# Patient Record
Sex: Male | Born: 1980 | Race: Black or African American | Hispanic: No | Marital: Married | Smoking: Current every day smoker
Health system: Southern US, Community
[De-identification: ages and names within clinical notes are randomized; demographics above are authoritative.]

## PROBLEM LIST (undated history)

## (undated) HISTORY — PX: MOUTH SURGERY: SHX715

---

## 2004-09-29 ENCOUNTER — Emergency Department: Payer: Self-pay | Admitting: Internal Medicine

## 2005-05-08 ENCOUNTER — Emergency Department: Payer: Self-pay | Admitting: General Practice

## 2007-11-14 ENCOUNTER — Emergency Department: Payer: Self-pay | Admitting: Emergency Medicine

## 2009-05-05 ENCOUNTER — Emergency Department: Payer: Self-pay | Admitting: Unknown Physician Specialty

## 2010-11-01 ENCOUNTER — Emergency Department: Payer: Self-pay | Admitting: Emergency Medicine

## 2012-03-27 ENCOUNTER — Emergency Department: Payer: Self-pay | Admitting: Emergency Medicine

## 2012-03-27 LAB — URINALYSIS, COMPLETE
Bacteria: NONE SEEN
Bilirubin,UR: NEGATIVE
Ketone: NEGATIVE
Leukocyte Esterase: NEGATIVE
Nitrite: NEGATIVE
Ph: 6 (ref 4.5–8.0)
Protein: NEGATIVE
Specific Gravity: 1.027 (ref 1.003–1.030)
Squamous Epithelial: NONE SEEN

## 2012-03-27 LAB — BASIC METABOLIC PANEL
BUN: 20 mg/dL — ABNORMAL HIGH (ref 7–18)
Calcium, Total: 9 mg/dL (ref 8.5–10.1)
Chloride: 106 mmol/L (ref 98–107)
Co2: 27 mmol/L (ref 21–32)
EGFR (Non-African Amer.): >60
Glucose: 87 mg/dL (ref 65–99)
Osmolality: 280 (ref 275–301)
Potassium: 3.4 mmol/L — ABNORMAL LOW (ref 3.5–5.1)
Sodium: 139 mmol/L (ref 136–145)

## 2012-03-27 LAB — CBC
HCT: 45.9 % (ref 40.0–52.0)
HGB: 15.3 g/dL (ref 13.0–18.0)
MCV: 93 fL (ref 80–100)
Platelet: 297 10*3/uL (ref 150–440)
RBC: 4.96 10*6/uL (ref 4.40–5.90)
RDW: 14.4 % (ref 11.5–14.5)
WBC: 9.8 10*3/uL (ref 3.8–10.6)

## 2012-08-04 ENCOUNTER — Emergency Department: Payer: Self-pay | Admitting: Emergency Medicine

## 2013-03-20 ENCOUNTER — Emergency Department: Payer: Self-pay | Admitting: Emergency Medicine

## 2013-07-25 ENCOUNTER — Emergency Department: Payer: Self-pay | Admitting: Emergency Medicine

## 2014-04-18 ENCOUNTER — Emergency Department: Payer: Self-pay | Admitting: Internal Medicine

## 2014-11-22 ENCOUNTER — Emergency Department: Payer: Self-pay

## 2014-11-22 ENCOUNTER — Emergency Department
Admission: EM | Admit: 2014-11-22 | Discharge: 2014-11-22 | Disposition: A | Discharge status: Home / Self Care | Payer: Self-pay | Attending: Emergency Medicine | Admitting: Emergency Medicine

## 2014-11-22 ENCOUNTER — Encounter: Payer: Self-pay | Admitting: *Deleted

## 2014-11-22 DIAGNOSIS — S3991XA Unspecified injury of abdomen, initial encounter: Secondary | ICD-10-CM | POA: Insufficient documentation

## 2014-11-22 DIAGNOSIS — M545 Low back pain, unspecified: Secondary | ICD-10-CM

## 2014-11-22 DIAGNOSIS — Y9389 Activity, other specified: Secondary | ICD-10-CM | POA: Insufficient documentation

## 2014-11-22 DIAGNOSIS — Z720 Tobacco use: Secondary | ICD-10-CM | POA: Insufficient documentation

## 2014-11-22 DIAGNOSIS — S3992XA Unspecified injury of lower back, initial encounter: Secondary | ICD-10-CM | POA: Insufficient documentation

## 2014-11-22 DIAGNOSIS — Y998 Other external cause status: Secondary | ICD-10-CM | POA: Insufficient documentation

## 2014-11-22 DIAGNOSIS — Y92410 Unspecified street and highway as the place of occurrence of the external cause: Secondary | ICD-10-CM | POA: Insufficient documentation

## 2014-11-22 LAB — COMPREHENSIVE METABOLIC PANEL
ALT: 19 U/L (ref 17–63)
ANION GAP: 8 (ref 5–15)
AST: 24 U/L (ref 15–41)
Albumin: 3.4 g/dL — ABNORMAL LOW (ref 3.5–5.0)
Alkaline Phosphatase: 65 U/L (ref 38–126)
BUN: 10 mg/dL (ref 6–20)
CHLORIDE: 105 mmol/L (ref 101–111)
CO2: 23 mmol/L (ref 22–32)
Calcium: 8.7 mg/dL — ABNORMAL LOW (ref 8.9–10.3)
Creatinine, Ser: 1.34 mg/dL — ABNORMAL HIGH (ref 0.61–1.24)
GFR calc non Af Amer: >60 mL/min (ref >60)
Glucose, Bld: 79 mg/dL (ref 65–99)
POTASSIUM: 3.8 mmol/L (ref 3.5–5.1)
SODIUM: 136 mmol/L (ref 135–145)
Total Bilirubin: 0.5 mg/dL (ref 0.3–1.2)
Total Protein: 6.5 g/dL (ref 6.5–8.1)

## 2014-11-22 LAB — CBC
HEMATOCRIT: 39.3 % — AB (ref 40.0–52.0)
Hemoglobin: 13.2 g/dL (ref 13.0–18.0)
MCH: 32 pg (ref 26.0–34.0)
MCHC: 33.7 g/dL (ref 32.0–36.0)
MCV: 95.1 fL (ref 80.0–100.0)
PLATELETS: 279 10*3/uL (ref 150–440)
RBC: 4.14 MIL/uL — ABNORMAL LOW (ref 4.40–5.90)
RDW: 13.6 % (ref 11.5–14.5)
WBC: 10.1 10*3/uL (ref 3.8–10.6)

## 2014-11-22 MED ORDER — IOHEXOL 350 MG/ML SOLN
100.0000 mL | Freq: Once | INTRAVENOUS | Status: AC | PRN
  Administered 2014-11-22: 100 mL via INTRAVENOUS

## 2014-11-22 MED ORDER — MORPHINE SULFATE (PF) 4 MG/ML IV SOLN
INTRAVENOUS | Status: AC
  Administered 2014-11-22: 4 mg
  Filled 2014-11-22: qty 1

## 2014-11-22 MED ORDER — ONDANSETRON HCL 4 MG/2ML IJ SOLN
INTRAMUSCULAR | Status: AC
  Administered 2014-11-22: 4 mg
  Filled 2014-11-22: qty 2

## 2014-11-22 MED ORDER — OXYCODONE-ACETAMINOPHEN 5-325 MG PO TABS: 1.0000 | ORAL_TABLET | ORAL | Status: DC | PRN
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
## 2014-11-22 NOTE — Discharge Instructions (Signed)
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
## 2014-11-22 NOTE — ED Notes (Signed)
Pt. Going home with family. 

## 2015-08-05 ENCOUNTER — Encounter: Payer: Self-pay | Admitting: Emergency Medicine

## 2015-08-05 ENCOUNTER — Emergency Department
Admission: EM | Admit: 2015-08-05 | Discharge: 2015-08-06 | Disposition: A | Discharge status: Home / Self Care | Payer: No Typology Code available for payment source | Attending: Emergency Medicine | Admitting: Emergency Medicine

## 2015-08-05 DIAGNOSIS — F1290 Cannabis use, unspecified, uncomplicated: Secondary | ICD-10-CM | POA: Insufficient documentation

## 2015-08-05 DIAGNOSIS — T63001A Toxic effect of unspecified snake venom, accidental (unintentional), initial encounter: Secondary | ICD-10-CM

## 2015-08-05 DIAGNOSIS — F17200 Nicotine dependence, unspecified, uncomplicated: Secondary | ICD-10-CM | POA: Insufficient documentation

## 2015-08-05 DIAGNOSIS — T63011A Toxic effect of rattlesnake venom, accidental (unintentional), initial encounter: Secondary | ICD-10-CM | POA: Insufficient documentation

## 2015-08-05 MED ORDER — SODIUM CHLORIDE 0.9 % IV BOLUS (SEPSIS)
1000.0000 mL | Freq: Once | INTRAVENOUS | Status: AC
Start: 2015-08-05 — End: 2015-08-05
  Administered 2015-08-05: 1000 mL via INTRAVENOUS

## 2015-08-05 MED ORDER — MORPHINE SULFATE (PF) 4 MG/ML IV SOLN
4.0000 mg | Freq: Once | INTRAVENOUS | Status: AC
  Administered 2015-08-05: 4 mg via INTRAVENOUS
  Filled 2015-08-05: qty 1

## 2015-08-05 MED ORDER — ONDANSETRON HCL 4 MG/2ML IJ SOLN
4.0000 mg | Freq: Once | INTRAMUSCULAR | Status: AC
  Administered 2015-08-05: 4 mg via INTRAVENOUS
  Filled 2015-08-05: qty 2
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
## 2015-08-05 NOTE — ED Provider Notes (Addendum)
[facility] Emergency Department Provider Note   ____________________________________________  Time seen: Approximately 850 PM  I have reviewed the triage vital signs and the nursing notes.   HISTORY  Chief Complaint Snake Bite   HPI Jake Goodwin is a 35 y.o. male without any chronic medical problems was presented to the emergency department after a copperhead bite at about 7:30 PM tonight. He says he was mowing his yard in sandals when he felt pain to the right lateral part of his right foot. He said that he saw the snake embedded in his foot at that time. He describes as a baby copperhead being about 6-7 inches long. He did not bring in a picture of the snake or the snake itself.  He said it may have been embedded in his foot for about 5-10 seconds. He describes her pain to the right lateral side of his foot at this time with mild swelling. He says that he is also had some nausea and vomiting and headache from the pain. Says that his last tetanus shot was 4 years ago.   History reviewed. No pertinent past medical history.  There are no active problems to display for this patient.   Past Surgical History  Procedure Laterality Date  . Mouth surgery      Current Outpatient Rx  Name  Route  Sig  Dispense  Refill  . oxyCODONE-acetaminophen (ROXICET) 5-325 MG tablet   Oral   Take 1 tablet by mouth every 4 (four) hours as needed for severe pain.   20 tablet   0     Allergies Review of patient's allergies indicates no known allergies.  No family history on file.  Social History Social History  Substance Use Topics  . Smoking status: Current Every Day Smoker  . Smokeless tobacco: Never Used  . Alcohol Use: No    Review of Systems Constitutional: No fever/chills Eyes: No visual changes. ENT: No sore throat. Cardiovascular: Denies chest pain. Respiratory: Denies shortness of breath. Gastrointestinal: No abdominal pain.  No diarrhea.  No  constipation. Genitourinary: Negative for dysuria. Musculoskeletal: Negative for back pain. Skin: Negative for rash. Neurological: Negative for focal weakness or numbness.  10-point ROS otherwise negative.  ____________________________________________   PHYSICAL EXAM:  VITAL SIGNS: ED Triage Vitals  Enc Vitals Group     BP 08/05/15 2053 124/73 mmHg     Pulse Rate 08/05/15 2053 96     Resp 08/05/15 2053 17     Temp --      Temp src --      SpO2 08/05/15 2053 98 %     Weight 08/05/15 2038 208 lb (94.348 kg)     Height 08/05/15 2038 5\' 10"  (1.778 m)     Head Cir --      Peak Flow --      Pain Score 08/05/15 2039 9     Pain Loc --      Pain Edu? --      Excl. in GC? --     Constitutional: Alert and oriented. Well appearing and in no acute distress. Eyes: Conjunctivae are normal. PERRL. EOMI. Head: Atraumatic. Nose: No congestion/rhinnorhea. Mouth/Throat: Mucous membranes are moist.  Oropharynx non-erythematous. Neck: No stridor.   Cardiovascular: Normal rate, regular rhythm. Grossly normal heart sounds.  Good peripheral circulation. Respiratory: Normal respiratory effort.  No retractions. Lungs CTAB. Gastrointestinal: Soft and nontender. No distention. No abdominal bruits. No CVA tenderness. Musculoskeletal: No lower extremity tenderness nor edema.  No joint effusions.Right lateral area of the foot with possible 1 cm area of swelling but this is directly over the base of the fifth metatarsal nurses similar area to the left foot. However, it is exactly where the patient's wife says he was bitten. Tenderness over this area. Neurologic:  Normal speech and language. No gross focal neurologic deficits are appreciated. No gait instability. sensation is intact to light touch. Skin:  Skin is warm, dry and intact. No rash noted.No bite marks noted. Psychiatric: Mood and affect are normal. Speech and behavior are normal.  ____________________________________________   LABS (all  labs ordered are listed, but only abnormal results are displayed)  Labs Reviewed - No data to display ____________________________________________  EKG   ____________________________________________  RADIOLOGY   ____________________________________________   PROCEDURES   Procedures    ____________________________________________   INITIAL IMPRESSION / ASSESSMENT AND PLAN / ED COURSE  Pertinent labs & imaging results that were available during my care of the patient were reviewed by me and considered in my medical decision making (see chart for details).  ----------------------------------------- 9:29 PM on 08/05/2015 -----------------------------------------  Discussed the case [facility] who has faxed over the guidelines for care of copperhead bite. I do not observe any swelling at this time. However, the patient does have increased pain. We will elevate the foot and reassess.  ----------------------------------------- 11:08 PM on 08/05/2015 -----------------------------------------  Patient's foot remains elevated at 45 angle. The patient is resting comfortably now the pain is relieved. We'll continue to observe for full 6 hour observation period. No swelling. Pain has not increased. At this point, it appears that the patient has not had an envenomation but more a dry bite. Signed out to Dr. Dolores FrameSung ____________________________________________   FINAL CLINICAL IMPRESSION(S) / ED DIAGNOSES  Snake bite.    NEW MEDICATIONS STARTED DURING THIS VISIT:  New Prescriptions   No medications on file     Note:  This document was prepared using Dragon voice recognition software and may include unintentional dictation errors.    Myrna Blazeravid Matthew Jakiah Bienaime, MD 08/05/15 2342  Patient went for wanting to leave at this time. The patient is currently pain-free at this time. There has been no swelling. The patient is completely sensate to light  touch. It is not completely ideal because ideally we would do a 6 hour observation. However, the symptoms have only been improving. It is likely that the patient did not have an envenomation. Because we are discharging the patient, upon their insistence, prior to the desired observation period, I did discussed return precautions with the patient as well as his wife. If there is any worsening in pain or swelling or any worsening or concerning symptoms they will return immediately. They're understanding of these plans and wanted to comply.  Myrna Blazeravid Matthew Mohamed Portlock, MD 08/06/15 [phone]

## 2015-08-06 NOTE — ED Notes (Signed)
Discharge instructions reviewed with patient. Patient verbalized understanding. Patient taken out to lobby via wheelchair by wife

## 2015-08-06 NOTE — ED Notes (Signed)
Pt called out. 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 We're not putting up with this 'watching him' bullshit anymore." MD notified and to bedside to speak with pt and wife. Pt being discharged despite the fact that it is earlier than poison control's recommended 6 hour observation time.

## 2016-04-23 ENCOUNTER — Emergency Department
Admission: EM | Admit: 2016-04-23 | Discharge: 2016-04-23 | Disposition: A | Discharge status: Home / Self Care | Payer: Self-pay | Attending: Emergency Medicine | Admitting: Emergency Medicine

## 2016-04-23 ENCOUNTER — Encounter: Payer: Self-pay | Admitting: Intensive Care

## 2016-04-23 DIAGNOSIS — K029 Dental caries, unspecified: Secondary | ICD-10-CM | POA: Insufficient documentation

## 2016-04-23 DIAGNOSIS — F17210 Nicotine dependence, cigarettes, uncomplicated: Secondary | ICD-10-CM | POA: Insufficient documentation

## 2016-04-23 MED ORDER — IBUPROFEN 800 MG PO TABS: 800.0000 mg | ORAL_TABLET | Freq: Three times a day (TID) | ORAL | 0 refills | Status: DC

## 2016-04-23 MED ORDER — PENICILLIN V POTASSIUM 500 MG PO TABS: 500.0000 mg | ORAL_TABLET | Freq: Four times a day (QID) | ORAL | 0 refills | Status: AC

## 2016-04-23 MED ORDER — AMOXICILLIN 875 MG PO TABS: 875.0000 mg | ORAL_TABLET | Freq: Two times a day (BID) | ORAL | 0 refills | Status: DC

## 2016-04-23 NOTE — ED Provider Notes (Signed)
[facility] Emergency Department Provider Note  ____________________________________________   First MD Initiated Contact with Patient 04/23/16 1236     (approximate)  I have reviewed the triage vital signs and the nursing notes.   HISTORY  Chief Complaint Dental Problem   HPI Jake Goodwin is a 36 y.o. male is here with complaint of dental pain for last 2-3 weeks. Patient states there is a history of a mandibular fracture and was told by his dentist in [facility] later anyone else at work on his teeth. Patient states that he called and cannot get an appointment until July. Patient has been taking Goody powders very frequently. He denies any fever or chills. No difficulty with swallowing. He rates his pain as an 8/10.   History reviewed. No pertinent past medical history.  There are no active problems to display for this patient.   Past Surgical History:  Procedure Laterality Date  . MOUTH SURGERY      Prior to Admission medications   Medication Sig Start Date End Date Taking? Authorizing Provider  ibuprofen (ADVIL,MOTRIN) 800 MG tablet Take 1 tablet (800 mg total) by mouth 3 (three) times daily. 04/23/16   Tommi Rumps, PA-C  penicillin v potassium (VEETID) 500 MG tablet Take 1 tablet (500 mg total) by mouth 4 (four) times daily. 04/23/16 04/30/16  Tommi Rumps, PA-C    Allergies Patient has no known allergies.  History reviewed. No pertinent family history.  Social History Social History  Substance Use Topics  . Smoking status: Current Every Day Smoker    Types: Cigarettes  . Smokeless tobacco: Never Used  . Alcohol use Yes     Comment: weekends    Review of Systems Constitutional: No fever/chills ENT: Positive dental pain. Cardiovascular: Denies chest pain. Respiratory: Denies shortness of breath. Gastrointestinal:  No nausea, no vomiting.   Skin: Negative for rash. Neurological: Negative for headaches  10-point ROS  otherwise negative.  ____________________________________________   PHYSICAL EXAM:  VITAL SIGNS: ED Triage Vitals  Enc Vitals Group     BP 04/23/16 1221 127/73     Pulse Rate 04/23/16 1221 92     Resp 04/23/16 1221 16     Temp 04/23/16 1221 98.7 F (37.1 C)     Temp Source 04/23/16 1221 Oral     SpO2 04/23/16 1221 99 %     Weight 04/23/16 1223 208 lb (94.3 kg)     Height 04/23/16 1223  (1.778 m)     Head Circumference --      Peak Flow --      Pain Score 04/23/16 1223 8     Pain Loc --      Pain Edu? --      Excl. in GC? --     Constitutional: Alert and oriented. Well appearing and in no acute distress. Eyes: Conjunctivae are normal. PERRL. EOMI. Head: Atraumatic. Nose: No congestion/rhinnorhea. Mouth/Throat: Mucous membranes are moist.  Oropharynx non-erythematous. Right lower wisdom tooth with cavity present. Moderate tenderness on palpation with a tongue depressor. There is minimal soft tissue swelling surrounding it however this is also tender. There is no active drainage noted. Neck: No stridor.   Hematological/Lymphatic/Immunilogical: Minimal right cervical lymphadenopathy. Cardiovascular: Normal rate, regular rhythm. Grossly normal heart sounds.  Good peripheral circulation. Respiratory: Normal respiratory effort.  No retractions. Lungs CTAB. Musculoskeletal: Moves upper and lower extremities without any difficulty. Normal gait was noted. Neurologic:  Normal speech and language. No gross focal  neurologic deficits are appreciated. No gait instability. Skin:  Skin is warm, dry and intact. No rash noted. Psychiatric: Mood and affect are normal. Speech and behavior are normal.  ____________________________________________   LABS (all labs ordered are listed, but only abnormal results are displayed)  Labs Reviewed - No data to display   PROCEDURES  Procedure(s) performed: None  Procedures  Critical Care performed:  No  ____________________________________________   INITIAL IMPRESSION / ASSESSMENT AND PLAN / ED COURSE  Pertinent labs & imaging results that were available during my care of the patient were reviewed by me and considered in my medical decision making (see chart for details).  Patient is given a prescription for Pen-Vee K 500 mg 1 every 6 hours for the next 7 days. He was also given ibuprofen every 8 hours with food as needed for pain. He is to discontinue taking Goody powders. He is also encouraged to call his dentist in [place] for any cancellations since he was told not to see any dentist other than [facility].      ____________________________________________   FINAL CLINICAL IMPRESSION(S) / ED DIAGNOSES  Final diagnoses:  Pain due to dental caries      NEW MEDICATIONS STARTED DURING THIS VISIT:  Discharge Medication List as of 04/23/2016  1:16 PM    START taking these medications   Details  penicillin v potassium (VEETID) 500 MG tablet Take 1 tablet (500 mg total) by mouth 4 (four) times daily., Starting Thu 04/23/2016, Until Thu 04/30/2016, Print         Note:  This document was prepared using Dragon voice recognition software and may include unintentional dictation errors.    Tommi Rumps, PA-C 04/23/16 1422    Jennye Moccasin, MD 04/29/16 0001

## 2016-04-23 NOTE — ED Triage Notes (Signed)
Patient c/o pain from his Right side lower wisdom tooth pain X2 weeks ago. Patient reports he sees a Education officer, communitydentist but has not scheduled an appointment yet. Minimal swelling noted

## 2016-04-23 NOTE — ED Notes (Signed)
FIRST NURSE: lower jaw dental pain (Right side) , x2 weeks

## 2016-04-23 NOTE — ED Notes (Signed)

## 2016-04-23 NOTE — Discharge Instructions (Signed)
Begin taking Pen V K 500 mg every 6 hours for infection.  Ibuprofen every 8 hours with food as needed for pain. Call your doctor for any cancellations. Soft foods until seen by your dentist.

## 2017-03-17 ENCOUNTER — Emergency Department: Payer: Self-pay

## 2017-03-17 ENCOUNTER — Other Ambulatory Visit: Payer: Self-pay

## 2017-03-17 ENCOUNTER — Emergency Department
Admission: EM | Admit: 2017-03-17 | Discharge: 2017-03-17 | Disposition: A | Discharge status: Home / Self Care | Payer: Self-pay | Attending: Emergency Medicine | Admitting: Emergency Medicine

## 2017-03-17 DIAGNOSIS — F17210 Nicotine dependence, cigarettes, uncomplicated: Secondary | ICD-10-CM | POA: Insufficient documentation

## 2017-03-17 DIAGNOSIS — R112 Nausea with vomiting, unspecified: Secondary | ICD-10-CM | POA: Insufficient documentation

## 2017-03-17 DIAGNOSIS — R1010 Upper abdominal pain, unspecified: Secondary | ICD-10-CM | POA: Insufficient documentation

## 2017-03-17 DIAGNOSIS — Z79899 Other long term (current) drug therapy: Secondary | ICD-10-CM | POA: Insufficient documentation

## 2017-03-17 LAB — URINALYSIS, COMPLETE (UACMP) WITH MICROSCOPIC
BACTERIA UA: NONE SEEN
Bilirubin Urine: NEGATIVE
Glucose, UA: NEGATIVE mg/dL
Hgb urine dipstick: NEGATIVE
KETONES UR: 5 mg/dL — AB
Leukocytes, UA: NEGATIVE
Nitrite: NEGATIVE
PH: 5 (ref 5.0–8.0)
Protein, ur: NEGATIVE mg/dL
SPECIFIC GRAVITY, URINE: 1.025 (ref 1.005–1.030)

## 2017-03-17 LAB — COMPREHENSIVE METABOLIC PANEL
ALBUMIN: 4.3 g/dL (ref 3.5–5.0)
ALT: 36 U/L (ref 17–63)
AST: 38 U/L (ref 15–41)
Alkaline Phosphatase: 79 U/L (ref 38–126)
Anion gap: 10 (ref 5–15)
BUN: 15 mg/dL (ref 6–20)
CHLORIDE: 105 mmol/L (ref 101–111)
CO2: 23 mmol/L (ref 22–32)
CREATININE: 1.46 mg/dL — AB (ref 0.61–1.24)
Calcium: 9.7 mg/dL (ref 8.9–10.3)
GFR calc Af Amer: >60 mL/min (ref >60)
GFR calc non Af Amer: 60 mL/min — ABNORMAL LOW (ref >60)
Glucose, Bld: 105 mg/dL — ABNORMAL HIGH (ref 65–99)
Potassium: 4 mmol/L (ref 3.5–5.1)
SODIUM: 138 mmol/L (ref 135–145)
Total Bilirubin: 0.9 mg/dL (ref 0.3–1.2)
Total Protein: 8 g/dL (ref 6.5–8.1)

## 2017-03-17 LAB — CBC
HEMATOCRIT: 43.8 % (ref 40.0–52.0)
Hemoglobin: 14.7 g/dL (ref 13.0–18.0)
MCH: 30.6 pg (ref 26.0–34.0)
MCHC: 33.5 g/dL (ref 32.0–36.0)
MCV: 91.3 fL (ref 80.0–100.0)
PLATELETS: 308 10*3/uL (ref 150–440)
RBC: 4.8 MIL/uL (ref 4.40–5.90)
RDW: 14.3 % (ref 11.5–14.5)
WBC: 7.5 10*3/uL (ref 3.8–10.6)

## 2017-03-17 LAB — LIPASE, BLOOD: LIPASE: 29 U/L (ref 11–51)

## 2017-03-17 MED ORDER — OMEPRAZOLE MAGNESIUM 20 MG PO TBEC: 20.0000 mg | DELAYED_RELEASE_TABLET | Freq: Every day | ORAL | 1 refills | Status: DC

## 2017-03-17 MED ORDER — ONDANSETRON HCL 4 MG PO TABS: ORAL_TABLET | ORAL | 0 refills | Status: DC

## 2017-03-17 MED ORDER — SUCRALFATE 1 G PO TABS: 1.0000 g | ORAL_TABLET | Freq: Four times a day (QID) | ORAL | 1 refills | Status: DC | PRN

## 2017-03-17 NOTE — ED Triage Notes (Signed)
Pt c/o upper abd pain with N/V since yesterday. Denies diarrhea. Denies hx of the same..Marland Kitchen

## 2017-03-17 NOTE — ED Notes (Signed)
Pt back from [place]

## 2017-03-17 NOTE — ED Provider Notes (Signed)
[facility] Emergency Department Provider Note  ____________________________________________   First MD Initiated Contact with Patient 03/17/17 1507     (approximate)  I have reviewed the triage vital signs and the nursing notes.   HISTORY  Chief Complaint Abdominal Pain    HPI Jake Goodwin is a 37 y.o. male with no specific chronic history who presents for evaluation of acute onset upper abdominal pain with associated nausea and vomiting that is been going on for a little more than 24 hours.  He states that he does drink alcohol but had not been drinking alcohol recently when the symptoms started.  He says that it started in the morning and has been severe but waxing and waning in intensity.  He states it is in his upper abdomen right below his ribs and sometimes radiates through to the back.  His nausea comes and goes and he is very sensitive to smells and has vomited several times.  He has not had much of an appetite and when he does try to eat he throws up.  He has no lower abdominal pain and no testicular pain.  He denies fever/chills, chest pain, shortness of breath, and dysuria.  He has never had similar symptoms in the past.  He describes the symptoms as severe overall.  History reviewed. No pertinent past medical history.  There are no active problems to display for this patient.   Past Surgical History:  Procedure Laterality Date  . MOUTH SURGERY      Prior to Admission medications   Medication Sig Start Date End Date Taking? Authorizing Provider  ibuprofen (ADVIL,MOTRIN) 800 MG tablet Take 1 tablet (800 mg total) by mouth 3 (three) times daily. 04/23/16   Tommi Rumps, PA-C  omeprazole (PRILOSEC OTC) 20 MG tablet Take 1 tablet (20 mg total) by mouth daily. 03/17/17 03/17/18  Loleta Rose, MD  ondansetron (ZOFRAN) 4 MG tablet Take 1-2 tabs by mouth every 8 hours as needed for nausea/vomiting 03/17/17   Loleta Rose, MD  sucralfate (CARAFATE)  1 g tablet Take 1 tablet (1 g total) by mouth 4 (four) times daily as needed (for abdominal discomfort, nausea, and/or vomiting). 03/17/17   Loleta Rose, MD    Allergies Patient has no known allergies.  No family history on file.  Social History Social History   Tobacco Use  . Smoking status: Current Every Day Smoker    Types: Cigarettes  . Smokeless tobacco: Never Used  Substance Use Topics  . Alcohol use: Yes    Comment: weekends  . Drug use: Yes    Types: Marijuana    Review of Systems Constitutional: No fever/chills Eyes: No visual changes. ENT: No sore throat. Cardiovascular: Denies chest pain. Respiratory: Denies shortness of breath. Gastrointestinal: Upper abdominal pain with associated nausea and vomiting as described above Genitourinary: Negative for dysuria. Musculoskeletal: Negative for neck pain.  Negative for back pain. Integumentary: Negative for rash. Neurological: Negative for headaches, focal weakness or numbness.   ____________________________________________   PHYSICAL EXAM:  VITAL SIGNS: ED Triage Vitals [03/17/17 1234]  Enc Vitals Group     BP 123/87     Pulse Rate 75     Resp 16     Temp 98.1 F (36.7 C)     Temp Source Oral     SpO2 100 %     Weight 96.2 kg (212 lb)     Height 1.778 m (5\' 10" )     Head Circumference  Peak Flow      Pain Score 6     Pain Loc      Pain Edu?      Excl. in GC?     Constitutional: Alert and oriented. Well appearing and in no acute distress. Eyes: Conjunctivae are normal.  Head: Atraumatic. Nose: No congestion/rhinnorhea. Mouth/Throat: Mucous membranes are moist. Neck: No stridor.  No meningeal signs.   Cardiovascular: Normal rate, regular rhythm. Good peripheral circulation. Grossly normal heart sounds. Respiratory: Normal respiratory effort.  No retractions. Lungs CTAB. Gastrointestinal: Soft with tenderness to palpation of the epigastrium and right upper quadrant but equivocal Murphy sign.   Nondistended.  No tenderness to palpation of the lower abdomen. Musculoskeletal: No lower extremity tenderness nor edema. No gross deformities of extremities. Neurologic:  Normal speech and language. No gross focal neurologic deficits are appreciated.  Skin:  Skin is warm, dry and intact. No rash noted. Psychiatric: Mood and affect are normal. Speech and behavior are normal.  ____________________________________________   LABS (all labs ordered are listed, but only abnormal results are displayed)  Labs Reviewed  COMPREHENSIVE METABOLIC PANEL - Abnormal; Notable for the following components:      Result Value   Glucose, Bld 105 (*)    Creatinine, Ser 1.46 (*)    GFR calc non Af Amer 60 (*)    All other components within normal limits  URINALYSIS, COMPLETE (UACMP) WITH MICROSCOPIC - Abnormal; Notable for the following components:   Color, Urine YELLOW (*)    APPearance CLEAR (*)    Ketones, ur 5 (*)    Squamous Epithelial / LPF 0-5 (*)    All other components within normal limits  LIPASE, BLOOD  CBC   ____________________________________________  EKG  None - EKG not ordered by ED physician ____________________________________________  RADIOLOGY   ED MD interpretation:  No acute abnormalities  Official radiology report(s): [place] Abdomen Limited Ruq  Result Date: 03/17/2017 CLINICAL DATA:  Epigastric and right upper quadrant pain radiating to the back. EXAM: ULTRASOUND ABDOMEN LIMITED RIGHT UPPER QUADRANT COMPARISON:  CT abdomen pelvis dated November 22, 2014. FINDINGS: Gallbladder: No gallstones or wall thickening visualized. No sonographic Murphy sign noted by sonographer. Common bile duct: Diameter: 3 mm, normal. Liver: No focal lesion identified. Within normal limits in parenchymal echogenicity. Portal vein is patent on color Doppler imaging with normal direction of blood flow towards the liver. IMPRESSION: Normal right upper quadrant ultrasound. Electronically Signed   By:  Obie DredgeWilliam T Derry M.D.   On: 03/17/2017 16:06    ____________________________________________   PROCEDURES  Critical Care performed: No   Procedure(s) performed:   Procedures   ____________________________________________   INITIAL IMPRESSION / ASSESSMENT AND PLAN / ED COURSE  As part of my medical decision making, I reviewed the following data within the electronic MEDICAL RECORD NUMBER Nursing notes reviewed and incorporated, Labs reviewed , Old chart reviewed and Notes from prior ED visits    Differential diagnosis includes, but is not limited to, biliary disease (biliary colic, acute cholecystitis, cholangitis, choledocholithiasis, etc), intrathoracic causes for epigastric abdominal pain including ACS, gastritis, duodenitis, pancreatitis, small bowel or large bowel obstruction, abdominal aortic aneurysm, hernia, and gastritis.  The presentation to me seems most consistent with biliary colic/cholecystitis although his labs are all within normal limits and reassuring.  He does have a slightly elevated creatinine but looking back in his past labs this is consistent.  GERD and gastritis are also possible.  Given the location of his tenderness I think it is appropriate  to check an ultrasound and then reassess.  He will likely benefit from symptomatic treatment and outpatient follow-up with his primary care provider at [facility].  He acknowledges that is his wish because he has charity care at [facility] and can get referred to a GI specialist as well. Clinical Course as of Mar 17 1801  Wed Mar 17, 2017  1622 No acute abnormalities identified on U/S.  Will discuss with patient. US ABDOMEN LIMITED RUQ [CF]  1645 Patient is still having some discomfort but is comfortable with the plan for a PPI, Carafate, Zofran, and outpatient follow-up.  I encouraged him to take the medicines as recommended and avoid alcohol.  He will follow-up at the next available opportunity.  His wife is now present at bedside and  assured me that she will help make sure that he follows up.  I gave my usual and customary return precautions.   [CF]    Clinical Course User Index [CF] Loleta Rose, MD    ____________________________________________  FINAL CLINICAL IMPRESSION(S) / ED DIAGNOSES  Final diagnoses:  Upper abdominal pain     MEDICATIONS GIVEN DURING THIS VISIT:  Medications - No data to display   ED Discharge Orders        Ordered    omeprazole (PRILOSEC OTC) 20 MG tablet  Daily     03/17/17 1655    sucralfate (CARAFATE) 1 g tablet  4 times daily PRN     03/17/17 1655    ondansetron (ZOFRAN) 4 MG tablet     03/17/17 1655       Note:  This document was prepared using Dragon voice recognition software and may include unintentional dictation errors.    Loleta Rose, MD 03/17/17 [phone]

## 2017-03-17 NOTE — Discharge Instructions (Signed)

## 2017-03-17 NOTE — ED Notes (Signed)
Pt alert and oriented X4, active, cooperative, pt in NAD. RR even and unlabored, color WNL.  Pt informed to return if any life threatening symptoms occur.  Discharge and followup instructions reviewed.  

## 2018-06-26 ENCOUNTER — Emergency Department: Payer: No Typology Code available for payment source

## 2018-06-26 ENCOUNTER — Emergency Department
Admission: EM | Admit: 2018-06-26 | Discharge: 2018-06-26 | Disposition: A | Discharge status: Home / Self Care | Payer: No Typology Code available for payment source | Attending: Emergency Medicine | Admitting: Emergency Medicine

## 2018-06-26 ENCOUNTER — Other Ambulatory Visit: Payer: Self-pay

## 2018-06-26 DIAGNOSIS — S32009A Unspecified fracture of unspecified lumbar vertebra, initial encounter for closed fracture: Secondary | ICD-10-CM | POA: Diagnosis not present

## 2018-06-26 DIAGNOSIS — Z79899 Other long term (current) drug therapy: Secondary | ICD-10-CM | POA: Insufficient documentation

## 2018-06-26 DIAGNOSIS — F17210 Nicotine dependence, cigarettes, uncomplicated: Secondary | ICD-10-CM | POA: Insufficient documentation

## 2018-06-26 DIAGNOSIS — Y939 Activity, unspecified: Secondary | ICD-10-CM | POA: Insufficient documentation

## 2018-06-26 DIAGNOSIS — Y999 Unspecified external cause status: Secondary | ICD-10-CM | POA: Diagnosis not present

## 2018-06-26 DIAGNOSIS — Y92410 Unspecified street and highway as the place of occurrence of the external cause: Secondary | ICD-10-CM | POA: Diagnosis not present

## 2018-06-26 DIAGNOSIS — S022XXA Fracture of nasal bones, initial encounter for closed fracture: Secondary | ICD-10-CM

## 2018-06-26 DIAGNOSIS — S0992XA Unspecified injury of nose, initial encounter: Secondary | ICD-10-CM | POA: Diagnosis present

## 2018-06-26 DIAGNOSIS — S301XXA Contusion of abdominal wall, initial encounter: Secondary | ICD-10-CM

## 2018-06-26 LAB — BASIC METABOLIC PANEL
Anion gap: 12 (ref 5–15)
BUN: 23 mg/dL — ABNORMAL HIGH (ref 6–20)
CO2: 20 mmol/L — ABNORMAL LOW (ref 22–32)
Calcium: 8.7 mg/dL — ABNORMAL LOW (ref 8.9–10.3)
Chloride: 106 mmol/L (ref 98–111)
Creatinine, Ser: 1.34 mg/dL — ABNORMAL HIGH (ref 0.61–1.24)
GFR calc Af Amer: >60 mL/min (ref >60)
GFR calc non Af Amer: >60 mL/min (ref >60)
Glucose, Bld: 84 mg/dL (ref 70–99)
Potassium: 4.2 mmol/L (ref 3.5–5.1)
Sodium: 138 mmol/L (ref 135–145)

## 2018-06-26 LAB — CBC WITH DIFFERENTIAL/PLATELET
Abs Immature Granulocytes: 0.07 10*3/uL (ref 0.00–0.07)
Basophils Absolute: 0 10*3/uL (ref 0.0–0.1)
Basophils Relative: 0 %
Eosinophils Absolute: 0 10*3/uL (ref 0.0–0.5)
Eosinophils Relative: 0 %
HCT: 41.7 % (ref 39.0–52.0)
Hemoglobin: 14.3 g/dL (ref 13.0–17.0)
Immature Granulocytes: 1 %
Lymphocytes Relative: 15 %
Lymphs Abs: 1.8 10*3/uL (ref 0.7–4.0)
MCH: 30.8 pg (ref 26.0–34.0)
MCHC: 34.3 g/dL (ref 30.0–36.0)
MCV: 89.7 fL (ref 80.0–100.0)
Monocytes Absolute: 0.7 10*3/uL (ref 0.1–1.0)
Monocytes Relative: 6 %
Neutro Abs: 9.2 10*3/uL — ABNORMAL HIGH (ref 1.7–7.7)
Neutrophils Relative %: 78 %
Platelets: 351 10*3/uL (ref 150–400)
RBC: 4.65 MIL/uL (ref 4.22–5.81)
RDW: 14.2 % (ref 11.5–15.5)
WBC: 11.8 10*3/uL — ABNORMAL HIGH (ref 4.0–10.5)
nRBC: 0 % (ref 0.0–0.2)

## 2018-06-26 MED ORDER — TRAMADOL HCL 50 MG PO TABS: 50.0000 mg | ORAL_TABLET | Freq: Four times a day (QID) | ORAL | 0 refills | Status: AC | PRN

## 2018-06-26 MED ORDER — ONDANSETRON HCL 4 MG/2ML IJ SOLN
4.0000 mg | Freq: Once | INTRAMUSCULAR | Status: AC
  Administered 2018-06-26: 4 mg via INTRAVENOUS
  Filled 2018-06-26: qty 2

## 2018-06-26 MED ORDER — MORPHINE SULFATE (PF) 4 MG/ML IV SOLN
4.0000 mg | Freq: Once | INTRAVENOUS | Status: AC
  Administered 2018-06-26: 4 mg via INTRAVENOUS
  Filled 2018-06-26: qty 1

## 2018-06-26 MED ORDER — IBUPROFEN 600 MG PO TABS: 600.0000 mg | ORAL_TABLET | Freq: Four times a day (QID) | ORAL | 0 refills | Status: DC | PRN

## 2018-06-26 MED ORDER — IOHEXOL 300 MG/ML  SOLN
100.0000 mL | Freq: Once | INTRAMUSCULAR | Status: AC | PRN
  Administered 2018-06-26: 100 mL via INTRAVENOUS

## 2018-06-26 MED ORDER — HYDROMORPHONE HCL 1 MG/ML IJ SOLN
0.5000 mg | Freq: Once | INTRAMUSCULAR | Status: AC
  Administered 2018-06-26: 0.5 mg via INTRAVENOUS
  Filled 2018-06-26: qty 1

## 2018-06-26 NOTE — ED Notes (Signed)
Topaz frozen so pt signed printed d/c paperwork. Pt wheeled out to lobby. Wife ride home.
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
## 2018-06-26 NOTE — Discharge Instructions (Addendum)
You have a broken nasal bone, and fractures in the L1 and L2 lumbar vertebrae.  You need to follow-up with the orthopedic specialist as well as with the ENT for these injuries.  Pain medication has been prescribed.  Return to the ER for new, worsening, or persistent severe pain, weakness or numbness in your legs, difficulty walking, severe headache, or any other new or worsening symptoms that concern you.

## 2018-06-26 NOTE — ED Notes (Signed)
Splint applied to pt's R wrist. Educated.
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
## 2018-06-26 NOTE — ED Notes (Signed)
Patient transported to CT 

## 2020-01-15 ENCOUNTER — Ambulatory Visit
Admission: EM | Admit: 2020-01-15 | Discharge: 2020-01-15 | Disposition: A | Discharge status: Home / Self Care | Payer: Self-pay

## 2020-01-15 ENCOUNTER — Other Ambulatory Visit: Payer: Self-pay

## 2020-01-15 DIAGNOSIS — K047 Periapical abscess without sinus: Secondary | ICD-10-CM
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
## 2020-01-15 NOTE — ED Triage Notes (Signed)
Patient complains of lower right dental pain that has been ongoing for 3 days. Patient with significant jaw swelling to right side. Unable to eat secondary to pain.

## 2020-01-15 NOTE — Discharge Instructions (Addendum)
Please go to the ER at [facility] for evaluation by oral surgery.
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
## 2021-01-30 ENCOUNTER — Other Ambulatory Visit: Payer: Self-pay

## 2021-01-30 ENCOUNTER — Emergency Department: Payer: Self-pay

## 2021-01-30 ENCOUNTER — Emergency Department
Admission: EM | Admit: 2021-01-30 | Discharge: 2021-01-30 | Disposition: A | Discharge status: Home / Self Care | Payer: Self-pay | Attending: Emergency Medicine | Admitting: Emergency Medicine

## 2021-01-30 DIAGNOSIS — M12811 Other specific arthropathies, not elsewhere classified, right shoulder: Secondary | ICD-10-CM | POA: Insufficient documentation

## 2021-01-30 DIAGNOSIS — F17210 Nicotine dependence, cigarettes, uncomplicated: Secondary | ICD-10-CM | POA: Insufficient documentation

## 2021-01-30 NOTE — ED Provider Notes (Signed)
[facility] Emergency Department Provider Note    ____________________________________________   Event Date/Time   First MD Initiated Contact with Patient 01/30/21 1116     (approximate)  I have reviewed the triage vital signs and the nursing notes.   HISTORY  Chief Complaint Shoulder Pain and Back Pain   HPI Jake Goodwin is a 40 y.o. male, no remarkable medical history, presents the emergency department for evaluation of right-sided shoulder pain.  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  Approximately 2 days ago, he was lifting one of them when he noticed a sharp pain in his right shoulder.  He says that the pain is predominantly on the backside of the shoulder.  Endorses limited range of motion.  Cannot lift arm above shoulder level.  Denies fever/chills, chest pain, shortness of breath, numbness/tingliness in upper extremities, or neck pain.  History limited by: No limitations  History reviewed. No pertinent past medical history.  There are no problems to display for this patient.   Past Surgical History:  Procedure Laterality Date   MOUTH SURGERY      Prior to Admission medications   Not on File    Allergies Patient has no known allergies.  History reviewed. No pertinent family history.  Social History Social History   Tobacco Use   Smoking status: Every Day    Types: Cigarettes   Smokeless tobacco: Never  Substance Use Topics   Alcohol use: Yes    Comment: weekends   Drug use: Yes    Types: Marijuana    Review of Systems Review of Systems  Constitutional:  Negative for chills and fever.  HENT:  Negative for ear pain and sore throat.   Eyes:  Negative for blurred vision.  Respiratory:  Negative for sputum production and shortness of breath.   Cardiovascular:  Negative for chest pain and leg swelling.  Gastrointestinal:  Negative for abdominal pain and vomiting.   Genitourinary:  Negative for dysuria, flank pain and hematuria.  Musculoskeletal:  Negative for myalgias.       Positive for right-sided shoulder pain.  Skin:  Negative for rash.  Neurological:  Negative for headaches.    10-point ROS otherwise negative. ____________________________________________   PHYSICAL EXAM:  VITAL SIGNS: ED Triage Vitals  Enc Vitals Group     BP 01/30/21 1119 126/83     Pulse Rate 01/30/21 1119 83     Resp 01/30/21 1119 18     Temp 01/30/21 1119 98.6 F (37 C)     Temp Source 01/30/21 1119 Oral     SpO2 01/30/21 1119 98 %     Weight 01/30/21 1101 195 lb 15.8 oz (88.9 kg)     Height 01/30/21 1101 5\' 10"  (1.778 m)     Head Circumference --      Peak Flow --      Pain Score 01/30/21 1101 8     Pain Loc --      Pain Edu? --      Excl. in [place]? --     Physical Exam Constitutional:      General: He is not in acute distress.    Appearance: Normal appearance. He is not ill-appearing.  HENT:     Head: Normocephalic.     Nose: Nose normal.     Mouth/Throat:     Mouth: Mucous membranes are moist.     Pharynx: Oropharynx is clear.  Eyes:  Conjunctiva/sclera: Conjunctivae normal.     Pupils: Pupils are equal, round, and reactive to light.  Cardiovascular:     Rate and Rhythm: Normal rate and regular rhythm.  Pulmonary:     Effort: Pulmonary effort is normal. No respiratory distress.     Breath sounds: Normal breath sounds. No wheezing, rhonchi or rales.  Abdominal:     General: Abdomen is flat. There is no distension.     Palpations: Abdomen is soft.  Musculoskeletal:     Cervical back: Normal range of motion and neck supple.     Comments: No gross deformities in the right shoulder.  Negative sulcus sign.  Limited range of motion, particularly with abduction above shoulder level.  Positive empty can sign.  Pulse and sensation intact distally.  Skin:    General: Skin is warm and dry.  Neurological:     General: No focal deficit present.      Mental Status: He is alert. Mental status is at baseline.  Psychiatric:        Mood and Affect: Mood normal.        Behavior: Behavior normal.        Thought Content: Thought content normal.        Judgment: Judgment normal.     ____________________________________________    LABS  (all labs ordered are listed, but only abnormal results are displayed)  Labs Reviewed - No data to display   ____________________________________________   EKG Not applicable   ____________________________________________    RADIOLOGY I personally viewed and evaluated these images as part of my medical decision making, as well as reviewing the written report by the radiologist.  ED Provider Interpretation: I agree with the interpretation of the radiologist.  Negative x-ray.  DG Shoulder Right  Result Date: 01/30/2021 CLINICAL DATA:  Right shoulder pain EXAM: RIGHT SHOULDER - 2+ VIEW COMPARISON:  None. FINDINGS: There is no evidence of fracture or dislocation. There is no evidence of arthropathy or other focal bone abnormality. Soft tissues are unremarkable. IMPRESSION: Negative. Electronically Signed   By: Macy Mis M.D.   On: 01/30/2021 11:55    ____________________________________________   PROCEDURES  Procedures   Medications - No data to display  Critical Care performed: No  ____________________________________________   INITIAL IMPRESSION / ASSESSMENT AND PLAN / ED COURSE  Pertinent labs & imaging results that were available during my care of the patient were reviewed by me and considered in my medical decision making (see chart for details).      Jake Goodwin is a 40 y.o. male,  no remarkable medical history, presents the emergency department for evaluation of right-sided shoulder pain.  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  Approximately 2 days ago, he was lifting one of them when he noticed a sharp pain in his right  shoulder.  He says that the pain is predominantly on the backside of the shoulder.  Endorses limited range of motion.  Cannot lift arm above shoulder level.  Denies fever/chills, chest pain, shortness of breath, numbness/tingliness in upper extremities, or neck pain.  Differentials include, but not limited to: Serious: ACS, intra-abdominal bleeding, pulmonary embolism, septic joint, shoulder dislocation, fracture (humerus, scapula, clavicle),  Common: rotator cuff injury, shoulder labrum tear, biceps tendinitis, impingement syndrome, adhesive capsulitis, AC joint injury   Patient appears well.  He is sitting upright comfortably in bed, NAD.  No gross deformities in the right shoulder.  Negative sulcus sign.  Limited  range of motion, particularly with abduction above shoulder level.  Positive empty can sign.  Pulse and sensation intact distally.  Given the patient's history and physical exam, I suspect rotator cuff injury.  Will place patient in a sling and refer for orthopedic follow-up.  Patient was discharged with anticipatory guidance and return precautions.  Encouraged the patient to return to the emergency department anytime if he begins to experience any new or worsening symptoms.      ____________________________________________   FINAL CLINICAL IMPRESSION(S) / ED DIAGNOSES  Final diagnoses:  Rotator cuff arthropathy of right shoulder     NEW MEDICATIONS STARTED DURING THIS VISIT:  ED Discharge Orders     None        Note:  This document was prepared using Dragon voice recognition software and may include unintentional dictation errors.    Varney Daily, [place] 01/30/21 2113    Delton Prairie, MD 01/31/21 [phone]

## 2021-01-30 NOTE — Discharge Instructions (Addendum)
-  Take Tylenol and ibuprofen/naproxen as needed for pain -Follow-up with the orthopedist listed above. -Please return to the emergency department at any time if you begin to experience any new or worsening symptoms.

## 2021-01-30 NOTE — ED Triage Notes (Signed)
Pt comes into the ED via POV c/o right shoulder pain and low back pain.  Pt states he may have pulled a muscle while at work.  Pt unable to lift right arm past 90 degrees.  Pt in NAD at this time with even and unlabored respirations.

## 2021-09-08 ENCOUNTER — Emergency Department: Payer: Self-pay

## 2021-09-08 ENCOUNTER — Emergency Department
Admission: EM | Admit: 2021-09-08 | Discharge: 2021-09-08 | Disposition: A | Discharge status: Home / Self Care | Payer: Self-pay | Attending: Emergency Medicine | Admitting: Emergency Medicine

## 2021-09-08 ENCOUNTER — Other Ambulatory Visit: Payer: Self-pay

## 2021-09-08 DIAGNOSIS — Y92096 Garden or yard of other non-institutional residence as the place of occurrence of the external cause: Secondary | ICD-10-CM | POA: Insufficient documentation

## 2021-09-08 DIAGNOSIS — W1842XA Slipping, tripping and stumbling without falling due to stepping into hole or opening, initial encounter: Secondary | ICD-10-CM | POA: Insufficient documentation

## 2021-09-08 DIAGNOSIS — S82831A Other fracture of upper and lower end of right fibula, initial encounter for closed fracture: Secondary | ICD-10-CM | POA: Insufficient documentation

## 2021-09-08 MED ORDER — NAPROXEN 500 MG PO TABS: 500.0000 mg | ORAL_TABLET | Freq: Two times a day (BID) | ORAL | 0 refills | Status: AC

## 2021-09-08 MED ORDER — HYDROCODONE-ACETAMINOPHEN 5-325 MG PO TABS: 1.0000 | ORAL_TABLET | Freq: Four times a day (QID) | ORAL | 0 refills | Status: AC | PRN

## 2021-09-08 MED ORDER — OXYCODONE HCL 5 MG PO TABS
5.0000 mg | ORAL_TABLET | Freq: Once | ORAL | Status: AC
  Administered 2021-09-08: 5 mg via ORAL
  Filled 2021-09-08: qty 1

## 2021-09-08 NOTE — Discharge Instructions (Signed)
Please wear the splint and use your crutches until you are evaluated by orthopedics.  Return to the emergency department for symptoms that change or worsen if you are unable to see orthopedics or primary care.

## 2021-09-08 NOTE — ED Triage Notes (Signed)
Pt to ED via POV from home. Pt reports right lower leg pain that started after rolling his ankle a few weeks ago. Pt reports it is now a constant throb from his ankle up to the back of his knee. Pt denies loss of sensation, SOB or CP. Pulse intact.

## 2021-09-08 NOTE — ED Notes (Signed)
See triage note  Presents with pain to right ankle and lower leg  States pain started 2 weeks ago after stepping in a hole  No swelling noted at present   Increased pain with ambulation

## 2021-09-08 NOTE — ED Provider Notes (Signed)
[facility] Provider Note    Event Date/Time   First MD Initiated Contact with Patient 09/08/21 1018     (approximate)   History   Leg Pain (Right )   HPI  Jake Goodwin is a 41 y.o. male presents to the emergency department for treatment and evaluation of right lower extremity pain.  He states that he stepped in a hole while helping his mom in the yard 2 weeks ago.  Pain has been persistent and getting worse.  Intermittent swelling of the right ankle and knee.  Pain increases with ambulation.  No alleviating measures attempted prior to arrival.  He was not evaluated immediately after the injury.  History reviewed. No pertinent past medical history.   Physical Exam   Triage Vital Signs: ED Triage Vitals [09/08/21 0958]  Enc Vitals Group     BP (!) 143/116     Pulse Rate 62     Resp 18     Temp 98.1 F (36.7 C)     Temp Source Oral     SpO2 100 %     Weight 209 lb (94.8 kg)     Height 5\' 9"  (1.753 m)     Head Circumference      Peak Flow      Pain Score 10     Pain Loc      Pain Edu?      Excl. in [place]?     Most recent vital signs: Vitals:   09/08/21 0958  BP: (!) 143/116  Pulse: 62  Resp: 18  Temp: 98.1 F (36.7 C)  SpO2: 100%    General: Awake, no distress.  CV:  Good peripheral perfusion.  Resp:  Normal effort.  Abd:  No distention.  Other:  Diffuse tenderness of the right lower extremity with about focal tenderness over the proximal tibia or fibula.  No focal tenderness of the ankle.  [place] ankle rules are negative.   ED Results / Procedures / Treatments   Labs (all labs ordered are listed, but only abnormal results are displayed) Labs Reviewed - No data to display   EKG     RADIOLOGY  Image of the right ankle shows healing distal fibula fracture.  Image of the right knee negative for acute concerns.  I have independently reviewed and interpreted imaging as well as reviewed report from  radiology.  PROCEDURES:  Critical Care performed: No  Procedures   MEDICATIONS ORDERED IN ED:  Medications  oxyCODONE (Oxy IR/ROXICODONE) immediate release tablet 5 mg (has no administration in time range)     IMPRESSION / MDM / ASSESSMENT AND PLAN / ED COURSE   I reviewed the triage vital signs and the nursing notes.  Differential diagnosis includes, but is not limited to: Knee strain, ankle sprain, fibula fracture, ankle fracture, DVT.  Patient's presentation is most consistent with acute complicated illness / injury requiring diagnostic workup.  40 year old male presenting to the emergency department for treatment and evaluation of right lower extremity pain after stepping in a hole 2 weeks ago.  See HPI for further details.  Images of the right ankle does show healing fracture of the distal fibula.  Results were discussed with the patient.  Plan will be to put him in a short leg OCL and have him use crutches.  He will be given follow-up information for orthopedics.  He was encouraged to call today to schedule follow-up appointment.  If symptoms change or worsen and he is  unable to be evaluated by Ortho or primary care, he is to return to the emergency department.     FINAL CLINICAL IMPRESSION(S) / ED DIAGNOSES   Final diagnoses:  Closed avulsion fracture of distal end of right fibula, initial encounter     Rx / DC Orders   ED Discharge Orders          Ordered    HYDROcodone-acetaminophen (NORCO/VICODIN) 5-325 MG tablet  Every 6 hours PRN        09/08/21 1123    naproxen (NAPROSYN) 500 MG tablet  2 times daily with meals        09/08/21 1123             Note:  This document was prepared using Dragon voice recognition software and may include unintentional dictation errors.   Chinita Pester, FNP 09/08/21 1127    Minna Antis, MD 09/08/21 1135

## 2023-02-25 ENCOUNTER — Emergency Department (HOSPITAL_COMMUNITY)
Admission: EM | Admit: 2023-02-25 | Discharge: 2023-02-25 | Discharge status: Against Medical Advice | Payer: 59 | Source: Home / Self Care

## 2023-08-10 ENCOUNTER — Emergency Department
Admission: EM | Admit: 2023-08-10 | Discharge: 2023-08-10 | Disposition: A | Discharge status: Home / Self Care | Payer: Self-pay | Attending: Emergency Medicine | Admitting: Emergency Medicine

## 2023-08-10 ENCOUNTER — Other Ambulatory Visit: Payer: Self-pay

## 2023-08-10 DIAGNOSIS — Z139 Encounter for screening, unspecified: Secondary | ICD-10-CM | POA: Insufficient documentation

## 2023-08-10 NOTE — ED Triage Notes (Signed)
 Pt to ED via [employer] for medical clearance. Pt [facility] the way to jail. Has no complaints
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
